# Patient Record
Sex: Male | Born: 1940 | Race: White | Hispanic: No | Marital: Married
Health system: Southern US, Community
[De-identification: ages and names within clinical notes are randomized; demographics above are authoritative.]

---

## 1998-04-28 ENCOUNTER — Ambulatory Visit (HOSPITAL_COMMUNITY): Admission: RE | Admit: 1998-04-28 | Discharge: 1998-04-28 | Payer: Self-pay | Admitting: Family Medicine

## 1998-05-11 ENCOUNTER — Encounter: Admission: RE | Admit: 1998-05-11 | Discharge: 1998-08-09 | Payer: Self-pay | Admitting: Family Medicine

## 2003-01-16 ENCOUNTER — Encounter: Payer: Self-pay | Admitting: Family Medicine

## 2003-01-16 ENCOUNTER — Ambulatory Visit (HOSPITAL_COMMUNITY): Admission: RE | Admit: 2003-01-16 | Discharge: 2003-01-16 | Payer: Self-pay | Admitting: Family Medicine

## 2009-09-08 ENCOUNTER — Encounter: Admission: RE | Admit: 2009-09-08 | Discharge: 2009-09-08 | Payer: Self-pay | Admitting: Family Medicine

## 2011-01-24 ENCOUNTER — Encounter (HOSPITAL_COMMUNITY)
Admission: RE | Admit: 2011-01-24 | Discharge: 2011-01-24 | Disposition: A | Discharge status: Home / Self Care | Payer: Medicare Other | Source: Ambulatory Visit | Attending: Orthopedic Surgery | Admitting: Orthopedic Surgery

## 2011-01-24 ENCOUNTER — Ambulatory Visit (HOSPITAL_COMMUNITY)
Admission: RE | Admit: 2011-01-24 | Discharge: 2011-01-24 | Disposition: A | Discharge status: Home / Self Care | Payer: Medicare Other | Source: Ambulatory Visit | Attending: Orthopedic Surgery | Admitting: Orthopedic Surgery

## 2011-01-24 ENCOUNTER — Other Ambulatory Visit (HOSPITAL_COMMUNITY): Payer: Self-pay | Admitting: Orthopedic Surgery

## 2011-01-24 DIAGNOSIS — M05451 Rheumatoid myopathy with rheumatoid arthritis of right hip: Secondary | ICD-10-CM

## 2011-01-24 DIAGNOSIS — Z01818 Encounter for other preprocedural examination: Secondary | ICD-10-CM | POA: Insufficient documentation

## 2011-01-24 DIAGNOSIS — Z01810 Encounter for preprocedural cardiovascular examination: Secondary | ICD-10-CM | POA: Insufficient documentation

## 2011-01-24 DIAGNOSIS — M069 Rheumatoid arthritis, unspecified: Secondary | ICD-10-CM | POA: Insufficient documentation

## 2011-01-24 DIAGNOSIS — I10 Essential (primary) hypertension: Secondary | ICD-10-CM | POA: Insufficient documentation

## 2011-01-24 DIAGNOSIS — G737 Myopathy in diseases classified elsewhere: Secondary | ICD-10-CM | POA: Insufficient documentation

## 2011-01-24 DIAGNOSIS — Z01812 Encounter for preprocedural laboratory examination: Secondary | ICD-10-CM | POA: Insufficient documentation

## 2011-01-24 LAB — CBC
HCT: 43.8 % (ref 39.0–52.0)
MCHC: 34.2 g/dL (ref 30.0–36.0)
Platelets: 213 10*3/uL (ref 150–400)
RDW: 12.7 % (ref 11.5–15.5)
WBC: 5.6 10*3/uL (ref 4.0–10.5)

## 2011-01-24 LAB — COMPREHENSIVE METABOLIC PANEL
ALT: 17 U/L (ref 0–53)
BUN: 14 mg/dL (ref 6–23)
CO2: 29 mEq/L (ref 19–32)
Calcium: 9.1 mg/dL (ref 8.4–10.5)
Creatinine, Ser: 0.98 mg/dL (ref 0.4–1.5)
GFR calc non Af Amer: >60 mL/min (ref >60)
Glucose, Bld: 98 mg/dL (ref 70–99)
Total Protein: 6.5 g/dL (ref 6.0–8.3)

## 2011-01-24 LAB — PROTIME-INR
INR: 0.94 (ref 0.00–1.49)
Prothrombin Time: 12.8 seconds (ref 11.6–15.2)

## 2011-01-24 LAB — DIFFERENTIAL
Basophils Absolute: 0 10*3/uL (ref 0.0–0.1)
Basophils Relative: 0 % (ref 0–1)
Eosinophils Absolute: 0.1 10*3/uL (ref 0.0–0.7)
Eosinophils Relative: 2 % (ref 0–5)
Lymphocytes Relative: 27 % (ref 12–46)
Lymphs Abs: 1.5 10*3/uL (ref 0.7–4.0)
Monocytes Absolute: 0.6 10*3/uL (ref 0.1–1.0)
Monocytes Relative: 11 % (ref 3–12)
Neutro Abs: 3.4 10*3/uL (ref 1.7–7.7)
Neutrophils Relative %: 60 % (ref 43–77)

## 2011-01-24 LAB — URINALYSIS, ROUTINE W REFLEX MICROSCOPIC
Hgb urine dipstick: NEGATIVE
Nitrite: NEGATIVE
Protein, ur: NEGATIVE mg/dL
Specific Gravity, Urine: 1.03 (ref 1.005–1.030)
Urobilinogen, UA: 0.2 mg/dL (ref 0.0–1.0)

## 2011-01-24 LAB — SURGICAL PCR SCREEN
MRSA, PCR: NEGATIVE
Staphylococcus aureus: NEGATIVE

## 2011-01-25 LAB — URINE CULTURE: Culture  Setup Time: 201203051527

## 2011-01-31 ENCOUNTER — Inpatient Hospital Stay (HOSPITAL_COMMUNITY)
Admission: RE | Admit: 2011-01-31 | Discharge: 2011-02-02 | DRG: 470 | Disposition: A | Discharge status: Home Health | Payer: Medicare Other | Source: Ambulatory Visit | Attending: Orthopedic Surgery | Admitting: Orthopedic Surgery

## 2011-01-31 DIAGNOSIS — M169 Osteoarthritis of hip, unspecified: Principal | ICD-10-CM | POA: Diagnosis present

## 2011-01-31 DIAGNOSIS — D62 Acute posthemorrhagic anemia: Secondary | ICD-10-CM | POA: Diagnosis not present

## 2011-01-31 DIAGNOSIS — M1610 Unilateral primary osteoarthritis, unspecified hip: Principal | ICD-10-CM | POA: Diagnosis present

## 2011-01-31 LAB — ABO/RH: ABO/RH(D): O POS

## 2011-02-01 LAB — BASIC METABOLIC PANEL
Calcium: 7.7 mg/dL — ABNORMAL LOW (ref 8.4–10.5)
Creatinine, Ser: 1 mg/dL (ref 0.4–1.5)
GFR calc Af Amer: >60 mL/min (ref >60)
Sodium: 134 mEq/L — ABNORMAL LOW (ref 135–145)

## 2011-02-01 LAB — CBC
MCH: 30.7 pg (ref 26.0–34.0)
MCHC: 34.6 g/dL (ref 30.0–36.0)
Platelets: 173 10*3/uL (ref 150–400)
RBC: 3.26 MIL/uL — ABNORMAL LOW (ref 4.22–5.81)

## 2011-02-02 LAB — CBC
Hemoglobin: 9.2 g/dL — ABNORMAL LOW (ref 13.0–17.0)
Platelets: 147 10*3/uL — ABNORMAL LOW (ref 150–400)
RBC: 2.98 MIL/uL — ABNORMAL LOW (ref 4.22–5.81)
WBC: 8 10*3/uL (ref 4.0–10.5)

## 2011-02-02 LAB — BASIC METABOLIC PANEL
CO2: 27 mEq/L (ref 19–32)
Chloride: 102 mEq/L (ref 96–112)
Creatinine, Ser: 1 mg/dL (ref 0.4–1.5)
GFR calc Af Amer: >60 mL/min (ref >60)
Potassium: 3.9 mEq/L (ref 3.5–5.1)
Sodium: 134 mEq/L — ABNORMAL LOW (ref 135–145)

## 2011-02-02 NOTE — Op Note (Signed)
NAMECAMILLO, QUADROS NO.:  [id number]  MEDICAL RECORD NO.:  [id number]           PATIENT TYPE:  I  LOCATION:  5014                         FACILITY:  [facility]  PHYSICIAN:  Mila Homer. Sherlean Foot, M.D. DATE OF BIRTH:  February 28, 1941  DATE OF PROCEDURE:  01/31/2011 DATE OF DISCHARGE:                              OPERATIVE REPORT   SURGEON:  Mila Homer. Sherlean Foot, MD  ASSISTANTS: 1. Altamese Cabal, PA-C 2. Laural Benes. Su Hilt, PA-C  PREOPERATIVE DIAGNOSIS:  Right hip osteoarthritis.  POSTOPERATIVE DIAGNOSIS:  Right hip osteoarthritis.  PROCEDURE:  Right total hip arthroplasty.  INDICATIONS FOR PROCEDURE:  The patient is a 70 year old white male with failure of conservative measures for osteoarthritis of the right hip. Informed consent was obtained.  DESCRIPTION OF PROCEDURE:  The patient was laid supine and administered general anesthesia.  Right hip was prepped and draped in the usual sterile fashion with the left down and right up lateral decubitus position.  A curvilinear incision was made centered over the trochanter. Cautery dissection was continued down through-and-through the fascia lata and trauma retractor was placed exposing the abductors.  I took the inferior layer of the gluteus medius minimus and vastus lateralis in a single sleeve of tissue and tagged with stay sutures.  I then placed a Hohmann retractor protecting the abductors and performed anterior hip capsulectomy.  I then marked out the neck cut with the neck cutting guide and made that cut with a reciprocating saw.  I removed the head and neck segment.  I then placed the Hohmann retractor anteriorly and posteriorly and removed the labrum circumferentially.  I then switched sides of the table with my PA and circumferentially reamed up to 56 mm. I then tamped in a trabecular metal cup since this was a rather deformed acetabular head and there was a cyst behind it.  I packed this, curetted out the cyst,  packed the cyst with Rewerts cancellous graft out of the head.  I then placed the 58 trabecular metal cup in, placed a 45 and 25 mm screw for further fixation, and then a standard liner receiving a #32 head.  I then went back to back side of the patient and put the leg in a sterile pouch anteriorly.  I then found the canal and then reamed to 13 broach, 13 trial with various head sizes and felt that a +0 was adequate.  I then removed the trial components, copiously irrigated and tamped down a fully porous-coated size 13 stem, and tamped on a clean 0 x +32 head, located the hip, took it to an aggressive range of motion and it did very well.  Then, I closed the medius minimus lateralis sleeve through drill holes and the trochanter oversewn with figure-of- eight #2 Tevdek, closed the fascia lata with running #1 Vicryls, buried 0-Vicryl, subcuticular 3-0 Vicryls, and skin staples.  Dressed with Mepilex dressing.  COMPLICATIONS:  None.  DRAINS:  None.          ______________________________ Mila Homer. Sherlean Foot, M.D.     SDL/MEDQ  D:  01/31/2011  T:  02/01/2011  Job:  [id number]  Electronically Signed by Georgena Spurling M.D. on 02/02/2011 11:29:48 AM

## 2011-02-03 LAB — CROSSMATCH: Unit division: 0

## 2011-05-18 ENCOUNTER — Other Ambulatory Visit: Payer: Self-pay | Admitting: Urology

## 2011-05-18 ENCOUNTER — Encounter (HOSPITAL_COMMUNITY): Payer: Medicare Other

## 2011-05-18 LAB — CBC
HCT: 44.9 % (ref 39.0–52.0)
Hemoglobin: 15 g/dL (ref 13.0–17.0)
MCV: 82.5 fL (ref 78.0–100.0)
Platelets: 206 10*3/uL (ref 150–400)
RBC: 5.44 MIL/uL (ref 4.22–5.81)
WBC: 5 10*3/uL (ref 4.0–10.5)

## 2011-05-18 LAB — BASIC METABOLIC PANEL
CO2: 28 mEq/L (ref 19–32)
Chloride: 101 mEq/L (ref 96–112)
Creatinine, Ser: 1.04 mg/dL (ref 0.50–1.35)
Glucose, Bld: 93 mg/dL (ref 70–99)

## 2011-05-18 LAB — SURGICAL PCR SCREEN: MRSA, PCR: NEGATIVE

## 2011-05-26 ENCOUNTER — Other Ambulatory Visit: Payer: Self-pay | Admitting: Urology

## 2011-05-26 ENCOUNTER — Inpatient Hospital Stay (HOSPITAL_COMMUNITY)
Admission: RE | Admit: 2011-05-26 | Discharge: 2011-05-27 | DRG: 708 | Disposition: A | Discharge status: Home / Self Care | Payer: Medicare Other | Source: Ambulatory Visit | Attending: Urology | Admitting: Urology

## 2011-05-26 DIAGNOSIS — Z01812 Encounter for preprocedural laboratory examination: Secondary | ICD-10-CM

## 2011-05-26 DIAGNOSIS — C61 Malignant neoplasm of prostate: Principal | ICD-10-CM | POA: Diagnosis present

## 2011-05-26 DIAGNOSIS — I10 Essential (primary) hypertension: Secondary | ICD-10-CM | POA: Diagnosis present

## 2011-05-26 LAB — TYPE AND SCREEN: Antibody Screen: NEGATIVE

## 2011-05-26 LAB — HEMOGLOBIN AND HEMATOCRIT, BLOOD: HCT: 39.9 % (ref 39.0–52.0)

## 2011-05-26 LAB — ABO/RH: ABO/RH(D): O POS

## 2011-05-27 LAB — HEMOGLOBIN AND HEMATOCRIT, BLOOD: HCT: 37.4 % — ABNORMAL LOW (ref 39.0–52.0)

## 2011-06-01 NOTE — Op Note (Signed)
NAMENEHAN, FLAUM NO.:  [id number]  MEDICAL RECORD NO.:  [id number]  LOCATION:  0004                         FACILITY:  [facility]  PHYSICIAN:  Excell Seltzer. Annabell Howells, M.D.    DATE OF BIRTH:  [birth year]  DATE OF PROCEDURE:  05/26/2011 DATE OF DISCHARGE:                              OPERATIVE REPORT   PROCEDURE:  Robot-assisted laparoscopic radical prostatectomy with nerve sparing.  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  SURGEON:  Excell Seltzer. Annabell Howells, M.D.  ASSISTANT:  Delia Chimes, NP  ANESTHESIA:  General.  DRAIN:  A 20-French Foley catheter and 19-French Blake drain.  BLOOD LOSS:  75 cc.  SPECIMEN:  Prostate and seminal vesicles.  COMPLICATIONS:  None.  INDICATIONS:  Bruce Lynn is a 70 year old white male who was initially evaluated for an elevated PSA of 5.4 by Dr. Vonita Moss.  Biopsy revealed 2cores of Gleason 6 tumor, one in the left base and one in the left mid prostate, each less than 5% involvement.  Prostate volume was 70 cc and after reviewing the options, he has elected radical prostatectomy for therapy.  FINDINGS AND PROCEDURE:  The patient had received routine preoperative teaching and physical therapy.  He had undergone mechanical bowel prep and was taken to the operating room where he was given 1 g of Ancef. General anesthetic was induced.  He was fitted with PAS hose and placed in the lithotomy position with arms tucked.  All pressure points padded. A red rubber rectal catheter was placed and connected to an Asepto syringe.  His genitalia was prepped with Betadine solution and his abdomen with ChloraPrep and after 3 minutes he was placed in the steep Trendelenburg position as his routine.  He was then draped in the usual sterile fashion and a 20-French Foley catheter was inserted.  The balloon was filled with 30 cc of sterile  fluid and the bladder was drained.  The tubing was placed to irrigation set.  The video port incision was then marked 18 cm superior to the pubis just below the umbilicus and a 2-cm incision was made with a knife.  This was carried down through the subcutaneous tissues initially with the Bovie followed by a hemostat, 2 [employer] to spread the subcutaneous tissue.  Anterior rectus fascia was identified, incised with a Bovie and the posterior sheath was then incised and the peritoneum was identified.  The hemostat was passed through the peritoneum and into the abdominal cavity.  A finger was placed which revealed no peri-port adhesions and 12 mm port was placed.  Skin around the port was secured with 0 Vicryl suture.  The abdomen was insufflated and inspected.  No significant adhesions were noted.  The remaining port sites were marked in the standard configuration with 2 robot ports on the left, a robot port in the right medial abdomen, a 12-mm assistant port in the right lateral abdomen and a 5-mm assistance port superomedial  Once all ports were in place, the robot which had been previously draped was brought to the field and docked.  At this  point, the dissection was initiated with cautery scissors in the right robot port and PK dissector in the left medial port and progressed in the left lateral port.  The obliterated umbilical arteries were divided and the peritoneum was incised transversely exposing the retropubic space.  The bladder was dropped away from the abdomen and the fluid which had been placed to aid identification was drained.  The anterior prostate was defatted.  The left endopelvic fascia was incised and lateral dissection was performed.  Puboprostatic ligaments were taken down and the dorsal vein was dissected out bilaterally.  This was then divided using an Endo-GIA.  The bladder neck was then identified using the Foley and incised transversely.  The patient was  noted to have a small middle lobe.  Once the Foley was identified, it was brought through the vesicostomy and used to apply anterior traction on the prostate.  The posterior bladder neck was then divided taking the dissection around the small middle lobe.  The dissection was carried down to the structures, the ampulla of vas were dissected out and divided.  The right seminal vesicle was then dissected out using blunt cautery dissection followed by left seminal vesicle.  The Denonvilliers fascia was then incised and the posterior plane was developed with the rectum and the prostate without difficulty.  At this point, we turned our attention to the nerve spare.  The prostate was reflected to the left and the right periprostatic fascia was incised on the anterolateral surface of the prostate.  Plane was felt between the prostate and the neurovascular bundle using gentle dissection from the base out to the apex.  Once the dissection had been carried down as far posteriorly as possible, we turned our attention to the left nerve spare which was performed in a similar fashion.  However, the neurovascular bundle was less identifiable on this side and at one point the prostate was entered during the dissection attempting to get sufficient tissue,  however, this was recognized and the dissection was carried outside the prostate.  Once the left nerve-sparing procedure was performed, we turned our attention to the right pedicle which was taken down using the Enseal device.  Residual lateral attachments were then taken down bluntly to the apex on the right.  The left pedicle was then divided with the Enseal and during this dissection, it appeared we got close to the prostate at the base, so the dissection was carried back outside the prostatic margin and then the residual lateral attachments were taken down to the apex.  The residual dorsal vein was then divided using the cautery.  The urethra was  divided using cold shears and the rectourethralis attachments were divided sharply and the prostate was moved out of the pelvis.  Inspection revealed no active bleeding.  The pelvis was irrigated and insufflated.  No evidence of rectal injury was noted.  At this point, a 2-0 Vicryl was used to secure the cut edge of Denonvilliers, the posterior bladder neck of the urethra and the rectourethralis complex using a slip-knot to gradually reapproximate the structures.  The patient was given indigo carmine.  Blue urine was noted at the bladder neck.  The ureteral orifices were proximal and were out of the anastomotic field.  A black color 4-0 Monocryl was used to perform a running urethrovesical anastomosis.  The initial tie was placed.  The permanent Foley using a 20-French Coude and 15 cc in the balloon was placed and the bladder was irrigated with no evidence  of bladder neck leakage.  The suture tie was completed.  The pelvis was inspected for bleeding, none was noted.  The 19-French Harrison Mons drain was then placed through the third arm working port and positioned appropriately in the pelvis.  The prostate was then moved back into the pelvis and grasped with grasping forceps.  The robot was undocked.  The prostate was placed in an entrapment sac and secured. The 12-mm assistance port was then removed and the port site was closed using 2-0 Vicryl suture with a suture passer.  The remaining ports were removed under direct vision.  No bleeding was noted.  The suture was removed from the video port site and the incision was extended sufficient to remove the prostate once the specimen had been removed. The anterior rectus fascia was closed using running 2-0 Vicryl.  The port sites were infiltrated with local anesthetic and closed with clips and drain was secured to the skin with a 3-0 nylon.  At this point, drapes were removed, dressings were applied.  The Foley was irrigated once again with blue  return and catheter was placed to straight drainage.  The red rubber rectal catheter was removed.  He was taken down from lithotomy position, his anesthetic was reversed.  He was moved to the recovery room in stable condition.  There were no complications.     Excell Seltzer. Annabell Howells, M.D.     JJW/MEDQ  D:  05/26/2011  T:  05/26/2011  Job:  [id number]  cc:   Bruce Lynn, M.D. Fax: [phone]  Electronically Signed by Bjorn Pippin M.D. on 06/01/2011 08:40:28 AM

## 2011-06-06 NOTE — H&P (Signed)
NAMECOLBURN, ASPER NO.:  [id number]  MEDICAL RECORD NO.:  [id number]  LOCATION:  1425                         FACILITY:  [facility]  PHYSICIAN:  Excell Seltzer. Annabell Howells, M.D.    DATE OF BIRTH:  1941/08/07  DATE OF ADMISSION:  05/26/2011 DATE OF DISCHARGE:                             HISTORY & PHYSICAL   HISTORY OF PRESENT ILLNESS:  This is a 70 year old gentleman, who presented to Dr. Annabell Howells for discussion of his recently diagnosed Gleason six prostate cancer with association high-grade PIN, which was found on his prior biopsy.  His PSA is 5.41, which is favorable.  He has low risk disease with two gross positive with 5% Gleason 6.  His PSS is 10 and his SHIM is 20.  He is in general good health.  He is status post right total hip arthroplasty in March, which was uncomplicated.  PAST MEDICAL HISTORY: 1. Arthritis. 2. Hypercholesterolemia. 3. Hypertension. 4. Prostatitis.  PAST SURGICAL HISTORY: 1. Right total hip arthrotomy 2. cataract surgery.  MEDICATIONS: 1. Aleve. 2. Glucosamine, sinus relief. 3. Tylenol, arthritis pain. 4. Lisinopril 10 mg.  ALLERGIES:  He has no known drug allergies.  FAMILY HISTORY:  His father is deceased at the age of 63 from lung cancer.  His mother is deceased at 78 years of age.  SOCIAL HISTORY:  He is married with two sons.  He is a retired Acupuncturist.  He does not currently use tobacco, although he does have a remote history of it.  REVIEW OF SYSTEMS:  He denies any fever, chills, nausea, vomiting, diarrhea or constipation.  He denies any recent weight loss or anorexia. He denies any chest pain or shortness of breath.  He denies any easy bruising, bleeding or lower extremity swelling.  PHYSICAL EXAMINATION:  VITAL SIGNS:  Temperature 97.5, pulse 66, respirations 16, blood pressure 121/73. CONSTITUTIONAL:  He is well-nourished and well-developed.  He is in no acute distress. HEENT:  Normocephalic, atraumatic.   Oropharynx is clear. ABDOMEN:  Soft and nontender. RECTAL:  Rectal exam demonstrates normal sphincter tone.  No tenderness. No masses.  The prostate has no nodularity and is nontender.  The left seminal vesicle is nonpalpable.  The right seminal vesicle is nonpalpable.  Perineum is normal upon inspection. GENITOURINARY:  Examination of the penis demonstrates no discharge, no masses, no lesions and a normal meatus.  The scrotum is without lesions. The right epididymis is palpably normal and nontender.  The left epididymis is found to have approximate 5 cm spermatocele, but nontender.  The right testis is nontender and without mass.  The left testes is nontender and without mass.  His left spermatocele transilluminates and is above the testicle and about the same size as the left testicle. LYMPHATICS:  The femoral and inguinal nodes are not enlarged or tender.  LABORATORY DATA:  Urinalysis:  Specific gravity 1.030, pH 5.5, glucose negative, bilirubin negative, ketone trace, blood negative, protein negative, urine globin 0.2, nitrite negative and leukocyte esterase is negative.  Sodium is 137, potassium 4.1, chloride 101, CO2 of 28, BUN 18, creatinine 1.04, glucose 93.  WBC is 5.0, hemoglobin 15.0, hematocrit 44.9, platelets of 206,000. Path report  is reviewed.  Prior office notes have been reviewed.  PSS is 10.  SHIM is 20.  ASSESSMENT:  Prostate cancer, T1C, Nx, Mx Gleason 6 low risk prostate cancer.  PLAN:  Mr. Coldren is a candidate for surveillance surgery or radiation, but would need downsizing proceed.  After reviewing his options, he remains most interested in surgical therapy.  Dr. Annabell Howells has given him information on penile rehabilitation and will set him up for physical therapy.  He is scheduled for a nerve sparing robotic prostatectomy on May 26, 2011.  The patient was counseled about the natural history of prostate cancer and the standard treatment options that are  available for prostate cancer.  It was explained to him how his age, life expectancy, clinical state, Gleason score and PSA effects his prognosis in addition to proceed with additional staging studies and as well as how that information influence recommend treatment strategy.  We discussed the role of active surveillance, radiation therapy, surgical therapy, androgen deprivation as well as ablated therapy options for the treatment of prostate cancer as appropriate to his individual cancer situation.  Dr. Annabell Howells discussed the risks and benefits of these options with regard to their impact on cancer control and also in terms of potential reverse effects, complications and impact of quality of life particularly related to urinary balance and sexual function.  The patient was encouraged to questions throughout this discussion and all questions were answered to his stated satisfaction.  In addition, the patient was provided with and/or directed to appropriate resources and monitored for further education about prostate cancer and treatment options.  Dr. Annabell Howells discussed surgical therapy for prostate cancer including the different available surgical approaches.  Dr. Annabell Howells discussed in detail the risks and expectations of surgery with regard to cancer control, urinary control and erectile function as well as expected postoperative recovery process.  Additional risks of surgery including but not limited to bleeding, infection, hernia formation, nerve damage, lymphocele formation, bowel/rectal injury potentially necessitating colostomy, damage to the urinary tract resulting in urine leakage, urethral stricture and the cardiopulmonary risk such as myocardial infarction, stroke, death, thromboembolism etc., were explained.  There risk of open and surgical conversion for robotic/laparoscopic prostatectomy was also discussed.     Delia Chimes, NP   ______________________________ Excell Seltzer. Annabell Howells,  M.D.    MA/MEDQ  D:  05/26/2011  T:  05/26/2011  Job:  [id number]  Electronically Signed by Delia Chimes NP on 06/06/2011 12:27:22 PM Electronically Signed by Bjorn Pippin M.D. on 06/06/2011 04:18:55 PM

## 2011-06-06 NOTE — Discharge Summary (Signed)
  Bruce Lynn, Bruce NO.:  [id number]  MEDICAL RECORD NO.:  [id number]  LOCATION:  1425                         FACILITY:  [facility]  PHYSICIAN:  Excell Seltzer. Annabell Howells, M.D.    DATE OF BIRTH:  March 10, 1941  DATE OF ADMISSION:  05/26/2011 DATE OF DISCHARGE:  05/27/2011                              DISCHARGE SUMMARY   ADMISSION DIAGNOSIS:  Clinically localized adenocarcinoma of the prostate (T1c Nx Mx).  PROCEDURE: Robotic-assisted laparoscopic radical prostatectomy with bilateral nerve spare.  HISTORY AND PHYSICAL: For full details, please see admission history and physical.  Briefly, [name] is a 70 year old gentleman who was found to have clinically localized adenocarcinoma of the prostate.  After careful consideration regarding major options for treatment, he elected to proceed with surgical therapy and a robotic-assisted laparoscopic radical prostatectomy.  HOSPITAL COURSE: On May 26, 2011, he was taken to the operating room where he underwent the above-named procedure which he tolerated well and without complications.  Postoperatively, he was able to be transferred to a regular hospital room following recovery from anesthesia.  He was found to be hemodynamically stable as noted by postop hemoglobin of 13.3.  He was also hemodynamically stable on postop day #1 as noted by hemoglobin of 12.5.  He was found to have excellent urine output with minimal output overnight and postoperative day #1.  Therefore, his pelvic drain was removed.  He was able to begin ambulation postoperatively.  He was able to begin a clear liquid diet on postoperative day #1.  On the afternoon of postoperative day #1, he was ambulating without difficulty. His pain was well managed, and he was tolerating clear liquid diet.  He was therefore felt stable for discharge home as he met all discharge criteria.  DISPOSITION: Home.  DISCHARGE INSTRUCTIONS: He was instructed to be ambulatory but  specifically told to refrain from any heavy lifting, strenuous activity or driving.  He was instructed on routine Foley catheter care and told to gradually advance his diet over the course of the next 24-48 hours.  DISCHARGE MEDICATIONS: He was instructed to resume all home medications consisting of lisinopril and chlorpheniramine.  In addition, he was provided a prescription for Vicodin to take as needed for pain and told to use Colace as stool softener.  He was also given a prescription for Cipro to begin 1 day prior to his return visit for removal of Foley catheter.  FOLLOWUP: He will follow up in 7 days for further postoperative evaluation including removal of Foley catheter and skin staples.     Delia Chimes, NP   ______________________________ Excell Seltzer. Annabell Howells, M.D.    MA/MEDQ  D:  05/27/2011  T:  05/27/2011  Job:  [id number]  Electronically Signed by Delia Chimes NP on 06/06/2011 12:27:30 PM Electronically Signed by Bjorn Pippin M.D. on 06/06/2011 04:18:58 PM

## 2012-06-29 IMAGING — CR DG CHEST 2V
2 series · 2 of 2 positions shown · non-contrast
Comparison: None

CLINICAL DATA: Preop.  Hypertension.

CHEST - 2 VIEW

[view not recorded (1 of 2)]
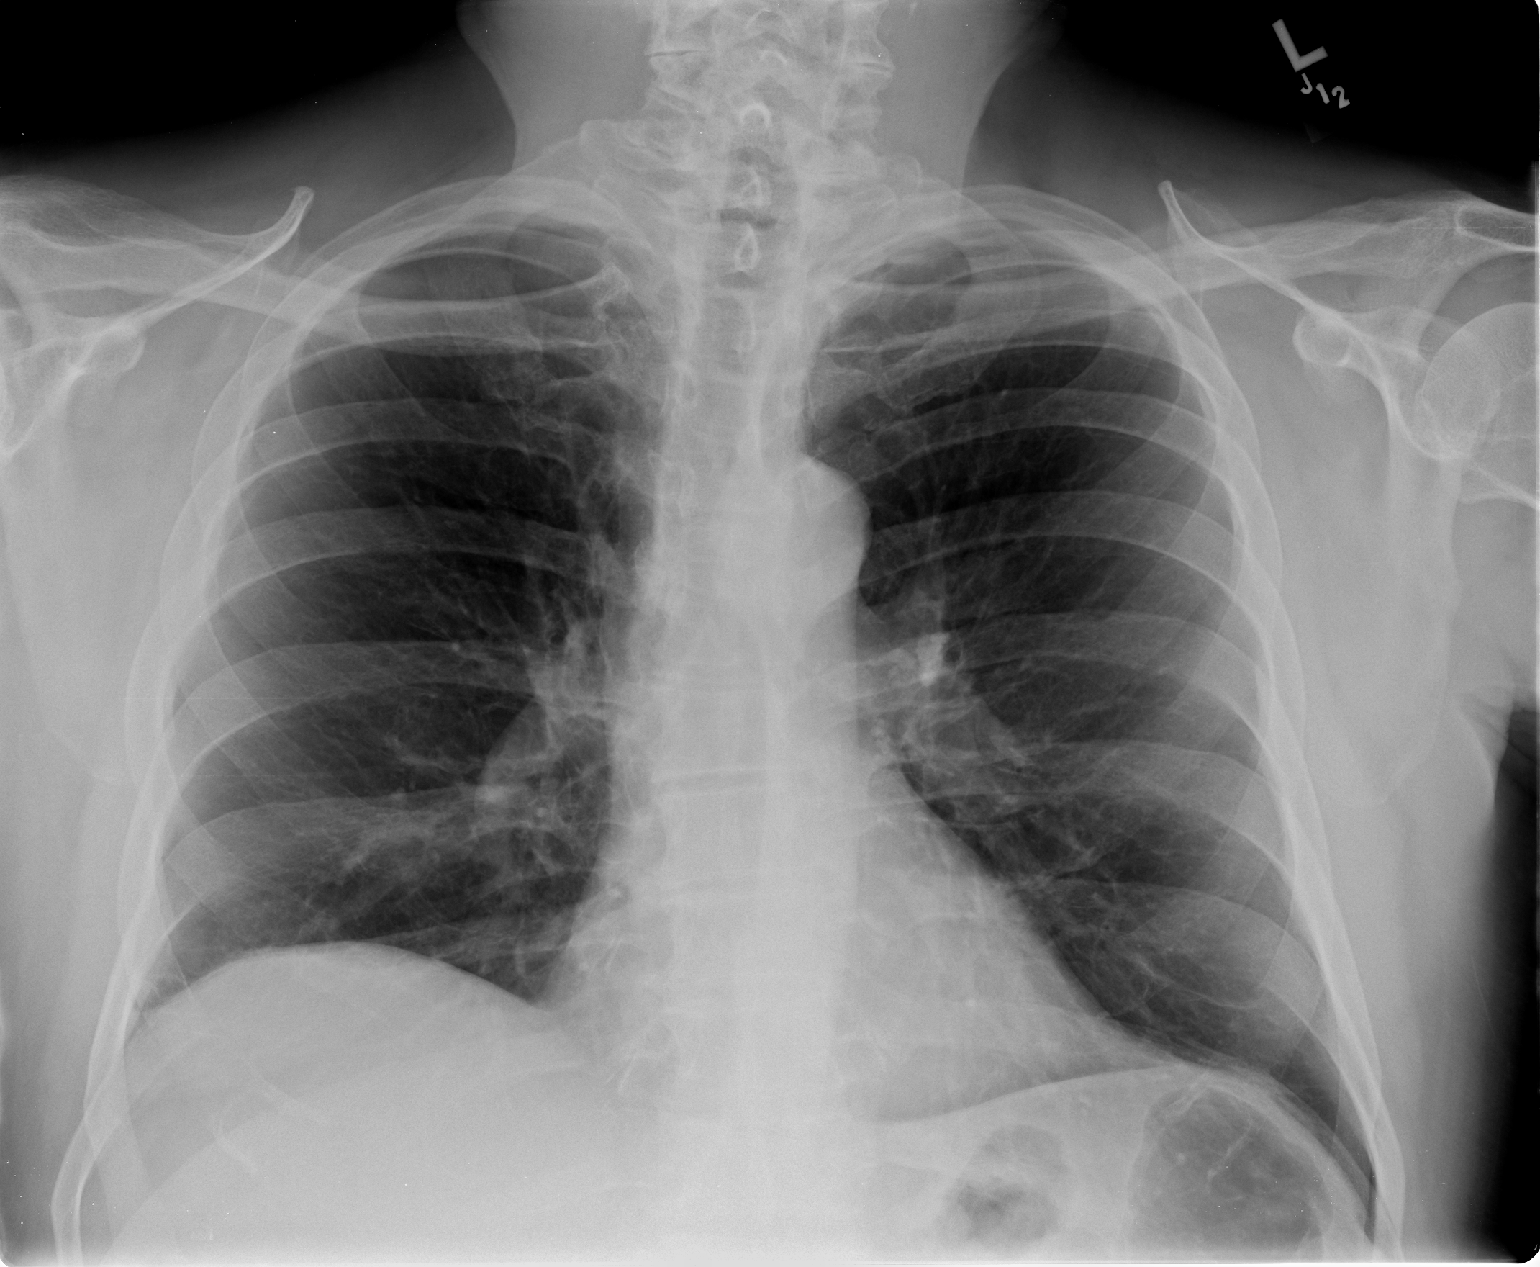

[view not recorded (2 of 2)]
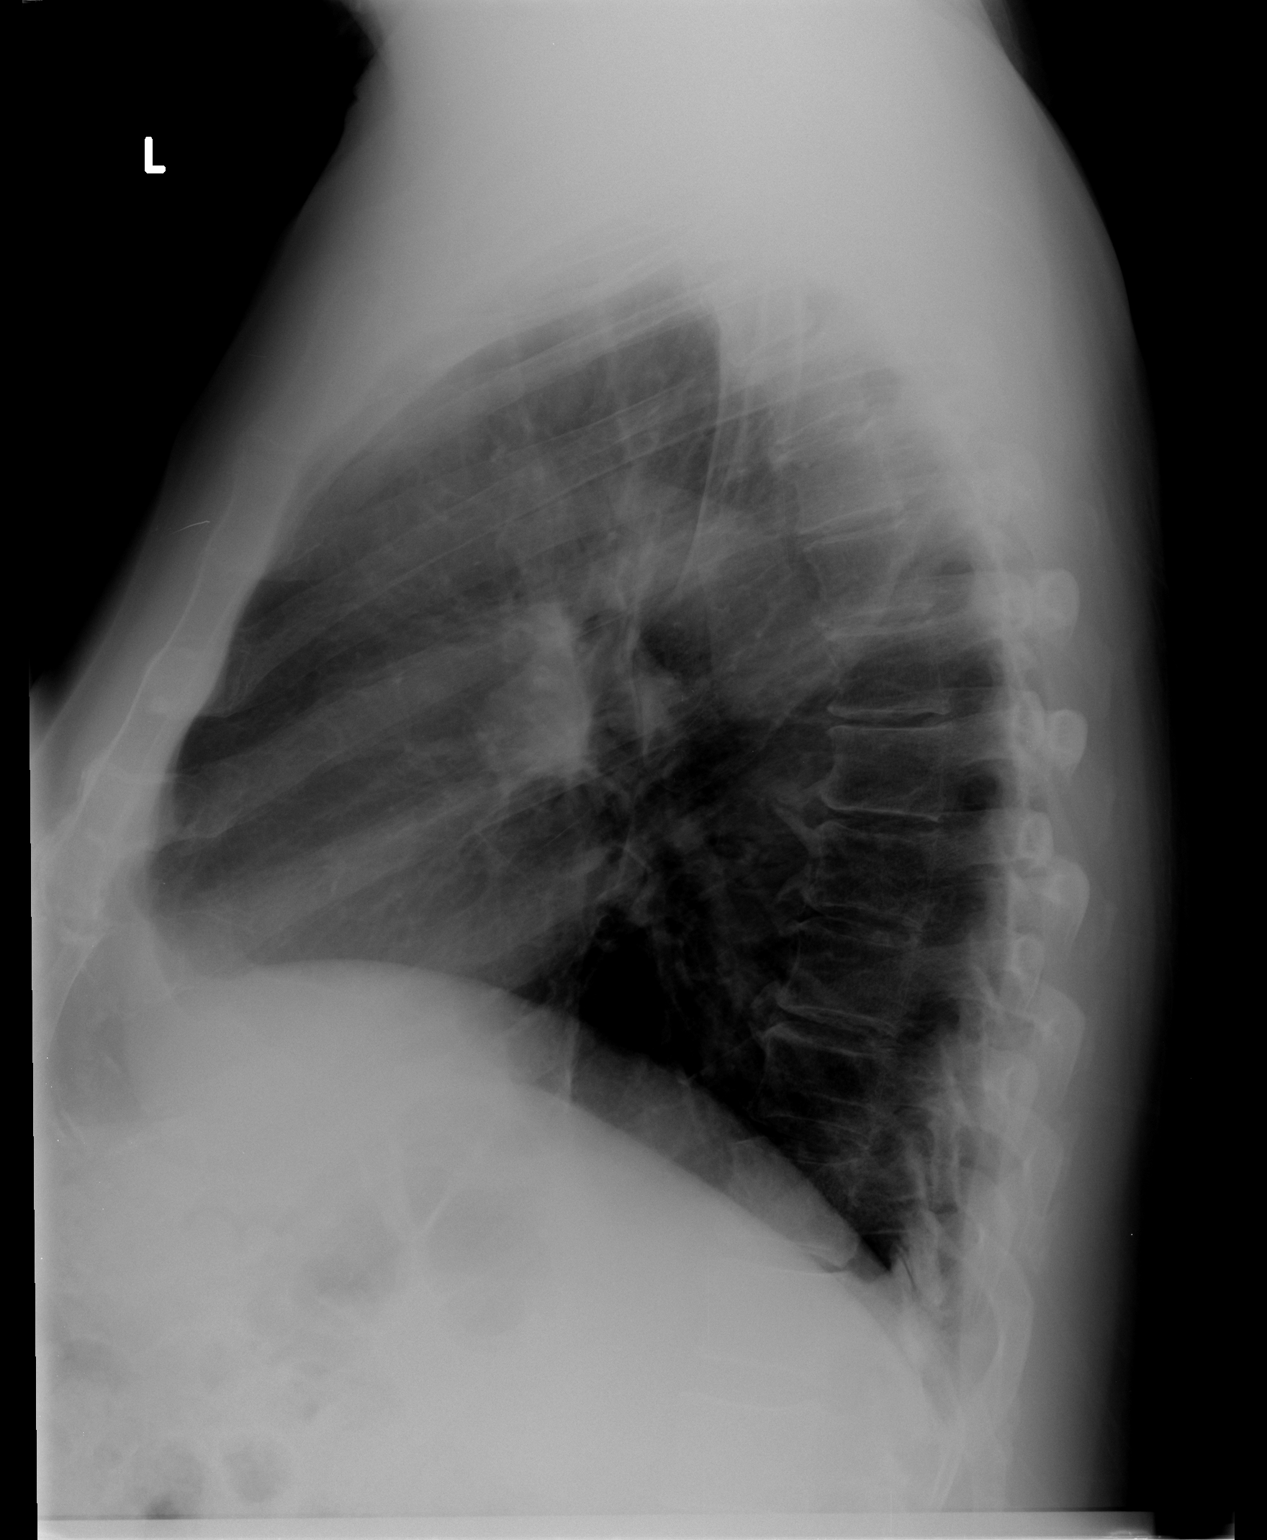

[2 of 2 positions shown; findings below may reference images not displayed]

FINDINGS: Heart size is normal.  Lungs are free of focal
consolidations and pleural effusions.  There is minimal right base
atelectasis.  Degenerative changes are seen in the spine.
IMPRESSION: No evidence for acute cardiopulmonary abnormality.

## 2017-09-25 ENCOUNTER — Encounter: Payer: Self-pay | Admitting: *Deleted

## 2017-09-25 NOTE — Progress Notes (Signed)
This encounter was created in error - please disregard.
